# Patient Record
Sex: Male | Born: 1965 | Race: White | Hispanic: No | Marital: Married | Smoking: Never smoker
Health system: Southern US, Community
[De-identification: ages and names within clinical notes are randomized; demographics above are authoritative.]

## PROBLEM LIST (undated history)

## (undated) DIAGNOSIS — R5383 Other fatigue: Secondary | ICD-10-CM

## (undated) DIAGNOSIS — E785 Hyperlipidemia, unspecified: Secondary | ICD-10-CM

---

## 2009-10-05 ENCOUNTER — Ambulatory Visit: Payer: Self-pay | Admitting: Internal Medicine

## 2011-04-03 ENCOUNTER — Ambulatory Visit: Payer: Self-pay | Admitting: Internal Medicine

## 2014-06-25 ENCOUNTER — Ambulatory Visit: Payer: Self-pay | Admitting: Family Medicine

## 2015-09-07 ENCOUNTER — Ambulatory Visit
Admission: EM | Admit: 2015-09-07 | Discharge: 2015-09-07 | Disposition: A | Discharge status: Home / Self Care | Payer: Managed Care, Other (non HMO) | Attending: Family Medicine | Admitting: Family Medicine

## 2015-09-07 ENCOUNTER — Encounter: Payer: Self-pay | Admitting: Gynecology

## 2015-09-07 DIAGNOSIS — R1032 Left lower quadrant pain: Secondary | ICD-10-CM | POA: Diagnosis not present

## 2015-09-07 HISTORY — DX: Hyperlipidemia, unspecified: E78.5

## 2015-09-07 HISTORY — DX: Other fatigue: R53.83

## 2015-09-07 MED ORDER — IBUPROFEN 600 MG PO TABS: 600.0000 mg | ORAL_TABLET | Freq: Four times a day (QID) | ORAL | Status: AC | PRN

## 2015-09-07 NOTE — Discharge Instructions (Signed)
Ibuprofen 600mg  times a day with food for pain and inflammation Warm moist compresses up to 4 times a day After 2-3 weeks*core strengthening exercises See primary care provider if pain persists, lump, Fever or new symptoms  Abdominal Pain, Adult Many things can cause abdominal pain. Usually, abdominal pain is not caused by a disease and will improve without treatment. It can often be observed and treated at home. Your health care provider will do a physical exam and possibly order blood tests and X-rays to help determine the seriousness of your pain. However, in many cases, more time must pass before a clear cause of the pain can be found. Before that point, your health care provider may not know if you need more testing or further treatment. HOME CARE INSTRUCTIONS Monitor your abdominal pain for any changes. The following actions may help to alleviate any discomfort you are experiencing:  Only take over-the-counter or prescription medicines as directed by your health care provider.  Do not take laxatives unless directed to do so by your health care provider.  Try a clear liquid diet (broth, tea, or water) as directed by your health care provider. Slowly move to a bland diet as tolerated. SEEK MEDICAL CARE IF:  You have unexplained abdominal pain.  You have abdominal pain associated with nausea or diarrhea.  You have pain when you urinate or have a bowel movement.  You experience abdominal pain that wakes you in the night.  You have abdominal pain that is worsened or improved by eating food.  You have abdominal pain that is worsened with eating fatty foods.  You have a fever. SEEK IMMEDIATE MEDICAL CARE IF:  Your pain does not go away within 2 hours.  You keep throwing up (vomiting).  Your pain is felt only in portions of the abdomen, such as the right side or the left lower portion of the abdomen.  You pass bloody or black tarry stools. MAKE SURE YOU:  Understand these  instructions.  Will watch your condition.  Will get help right away if you are not doing well or get worse.   This information is not intended to replace advice given to you by your health care provider. Make sure you discuss any questions you have with your health care provider.   Document Released: 12/30/2004 Document Revised: 12/11/2014 Document Reviewed: 11/29/2012 Elsevier Interactive Patient Education [employer]

## 2015-09-07 NOTE — ED Provider Notes (Signed)
CSN: [id number]     Arrival date & time 09/07/15  1212 History   First MD Initiated Contact with Patient 09/07/15 1404     Chief Complaint  Patient presents with  . Abdominal Pain   HPI   Maree KrabbeRobert A Losasso is a pleasant 50 y.o. male who presents with acute abdominal pain. He states he was and how I last week when he developed left lower quadrant pain. The pain is described as a burning and pulling type pain that last a few seconds to a minute and occurs intermittently throughout the day. It always occurs with bending over and sitting straight up. Pain currently is 0/10. Pain does not radiate. Denies any history of GI problems. His gums been normal 1-2 daily. Denies rectal bleeding, melena, mucus in his stools. Denies fever chills. He has not tried anything for the pain.  Past Medical History  Diagnosis Date  . Hyperlipemia   . Fatigue    History reviewed. No pertinent past surgical history. History reviewed. No pertinent family history. Social History  Substance Use Topics  . Smoking status: Former Games developermoker  . Smokeless tobacco: None  . Alcohol Use: Yes    Review of Systems  Constitutional: Negative.   HENT: Negative.   Eyes: Negative.   Respiratory: Negative.   Cardiovascular: Negative.   Genitourinary: Negative.   Musculoskeletal: Negative.   Skin: Negative.   Neurological: Negative.   Hematological: Negative.   Psychiatric/Behavioral: Negative.     Allergies  Review of patient's allergies indicates no known allergies.  Home Medications   Prior to Admission medications   Medication Sig Start Date End Date Taking? Authorizing Provider  ibuprofen (ADVIL,MOTRIN) 600 MG tablet Take 1 tablet (600 mg total) by mouth every 6 (six) hours as needed. 09/07/15   Joselyn ArrowKandice L Christon Gallaway, NP   Meds Ordered and Administered this Visit  Medications - No data to display  BP 130/82 mmHg  Pulse 68  Temp(Src) 97.8 F (36.6 C) (Oral)  Resp 16  Ht 1' (0.305 m)  Wt 180 lb (81.647 kg)  BMI 877.69  kg/m2  SpO2 98% No data found.   Physical Exam  Constitutional: He is oriented to person, place, and time. He appears well-developed and well-nourished. No distress.  HENT:  Head: Normocephalic and atraumatic.  Eyes: Conjunctivae are normal. No scleral icterus.  Neck: Normal range of motion.  Cardiovascular: Normal rate and regular rhythm.   Pulmonary/Chest: Effort normal and breath sounds normal. No respiratory distress.  Abdominal: Soft. Bowel sounds are normal. He exhibits no distension. There is tenderness in the left lower quadrant. There is no rebound and no guarding. No hernia.  He has a positive Carnett sign with tenderness to the left lower quadrant abdominal wall with abdominal flexion. No appreciation of significant hernia.  Musculoskeletal: Normal range of motion. He exhibits no edema or tenderness.  Neurological: He is alert and oriented to person, place, and time. No cranial nerve deficit.  Skin: Skin is warm and dry. No rash noted. No erythema.  Psychiatric: His behavior is normal. Judgment normal.  Nursing note and vitals reviewed.   ED Course  Procedures NA  MDM   1. Abdominal wall pain in left lower quadrant   Abdominal pain is likely left lower quadrant abdominal wall strain given his tenderness with direct palpation to his flexed abdominal wall. It is also reassuring that the symptoms only last seconds to minutes and there is no change in bowel habits.  No direct appreciation of herniation at  this time although he was given alarm features to watch out for including bulge, pain, or fever.   Plan: Diagnosis reviewed with patient & wife Rx as per orders;  benefits, risks, potential side effects reviewed  Recommend supportive treatment with rest, Warm compresses 4 times a day, and gradual core strengthening exercises See primary care provider if pain persists, lump, Fever or new symptoms   Joselyn Arrow, NP 09/07/15 1654

## 2015-09-07 NOTE — ED Notes (Signed)
[place] stated [place] and them to [place]. Per patient Pain in Left lower Quadrant pain x couple weeks start while he was in [place]. Patient denies NVD.

## 2017-06-20 ENCOUNTER — Ambulatory Visit
Admission: EM | Admit: 2017-06-20 | Discharge: 2017-06-20 | Disposition: A | Discharge status: Home / Self Care | Payer: Managed Care, Other (non HMO) | Attending: Family Medicine | Admitting: Family Medicine

## 2017-06-20 ENCOUNTER — Ambulatory Visit (INDEPENDENT_AMBULATORY_CARE_PROVIDER_SITE_OTHER): Payer: Managed Care, Other (non HMO)

## 2017-06-20 ENCOUNTER — Encounter: Payer: Self-pay | Admitting: *Deleted

## 2017-06-20 DIAGNOSIS — M25532 Pain in left wrist: Secondary | ICD-10-CM | POA: Diagnosis not present

## 2017-06-20 DIAGNOSIS — S63502A Unspecified sprain of left wrist, initial encounter: Secondary | ICD-10-CM

## 2017-06-20 DIAGNOSIS — W19XXXA Unspecified fall, initial encounter: Secondary | ICD-10-CM | POA: Diagnosis not present

## 2017-06-20 NOTE — ED Triage Notes (Signed)
Pt fell during "fencing" class last night. While stepping back, pt lost balance and fell landing on left arm. Left arm now discolored, mildly edematous, and painful.

## 2017-06-20 NOTE — ED Provider Notes (Signed)
[facility]    CSN: [id number] Arrival date & time: 06/20/17  1214     History   Chief Complaint Chief Complaint  Patient presents with  . Wrist Pain    HPI Edwin Cooper is a 52 y.o. male.   52 yo male with a c/o left wrist pain and swelling after injuring it yesterday while fencing. States he lost his balance, falling backwards and landing on his outstretched left hand.    The history is provided by the patient.    Past Medical History:  Diagnosis Date  . Fatigue   . Hyperlipemia     There are no active problems to display for this patient.   History reviewed. No pertinent surgical history.     Home Medications    Prior to Admission medications   Medication Sig Start Date End Date Taking? Authorizing Provider  ibuprofen (ADVIL,MOTRIN) 600 MG tablet Take 1 tablet (600 mg total) by mouth every 6 (six) hours as needed. 09/07/15   Joselyn Arrow, NP    Family History Family History  Problem Relation Age of Onset  . Healthy Mother   . Healthy Father     Social History Social History   Tobacco Use  . Smoking status: Never Smoker  . Smokeless tobacco: Never Used  Substance Use Topics  . Alcohol use: Yes  . Drug use: No     Allergies   Patient has no known allergies.   Review of Systems Review of Systems   Physical Exam Triage Vital Signs ED Triage Vitals  Enc Vitals Group     BP 06/20/17 1226 124/80     Pulse Rate 06/20/17 1226 64     Resp 06/20/17 1226 16     Temp 06/20/17 1226 98.8 F (37.1 C)     Temp Source 06/20/17 1226 Oral     SpO2 06/20/17 1226 98 %     Weight 06/20/17 1230 185 lb (83.9 kg)     Height 06/20/17 1230 6\' 1"  (1.854 m)     Head Circumference --      Peak Flow --      Pain Score 06/20/17 1228 7     Pain Loc --      Pain Edu? --      Excl. in GC? --    No data found.  Updated Vital Signs BP 124/80 (BP Location: Left Arm)   Pulse 64   Temp 98.8 F (37.1 C) (Oral)   Resp 16   Ht 6\' 1"  (1.854  m)   Wt 185 lb (83.9 kg)   SpO2 98%   BMI 24.41 kg/m   Visual Acuity Right Eye Distance:   Left Eye Distance:   Bilateral Distance:    Right Eye Near:   Left Eye Near:    Bilateral Near:     Physical Exam  Constitutional: He appears well-developed and well-nourished. No distress.  Musculoskeletal:       Left wrist: He exhibits tenderness, bony tenderness and swelling (mild). He exhibits normal range of motion, no effusion, no crepitus, no deformity and no laceration.  Skin: He is not diaphoretic.  Nursing note and vitals reviewed.    UC Treatments / Results  Labs (all labs ordered are listed, but only abnormal results are displayed) Labs Reviewed - No data to display  EKG  EKG Interpretation None       Radiology Dg Wrist Complete Left  Result Date: 06/20/2017 CLINICAL DATA:  The patient suffered a  left wrist injury due to a fall while fencing yesterday. Initial encounter. EXAM: LEFT WRIST - COMPLETE 3+ VIEW COMPARISON:  None. FINDINGS: There is no evidence of fracture or dislocation. There is no evidence of arthropathy or other focal bone abnormality. Soft tissues are unremarkable. IMPRESSION: Negative exam. Electronically Signed   By: Drusilla Kannerhomas  Dalessio M.D.   On: 06/20/2017 12:54    Procedures Procedures (including critical care time)  Medications Ordered in UC Medications - No data to display   Initial Impression / Assessment and Plan / UC Course  I have reviewed the triage vital signs and the nursing notes.  Pertinent labs & imaging results that were available during my care of the patient were reviewed by me and considered in my medical decision making (see chart for details).       Final Clinical Impressions(s) / UC Diagnoses   Final diagnoses:  Sprain of left wrist, initial encounter    ED Discharge Orders    None     1. x-ray results and diagnosis reviewed with patient 2. rx as per orders above; reviewed possible side effects, interactions,  risks and benefits  3. Recommend supportive treatment with rest, ice, elevation 4. Follow-up prn if symptoms worsen or don't improve  Controlled Substance Prescriptions [place] Controlled Substance Registry consulted? Not Applicable   Payton Mccallumonty, Sriansh Farra, MD 06/20/17 [phone]

## 2018-12-20 IMAGING — CR DG WRIST COMPLETE 3+V*L*
4 series · 4 of 4 positions shown · non-contrast
Comparison: None.

CLINICAL DATA: The patient suffered a left wrist injury due to a
fall while fencing yesterday. Initial encounter.

EXAM:
LEFT WRIST - COMPLETE 3+ VIEW

[wrist pa]
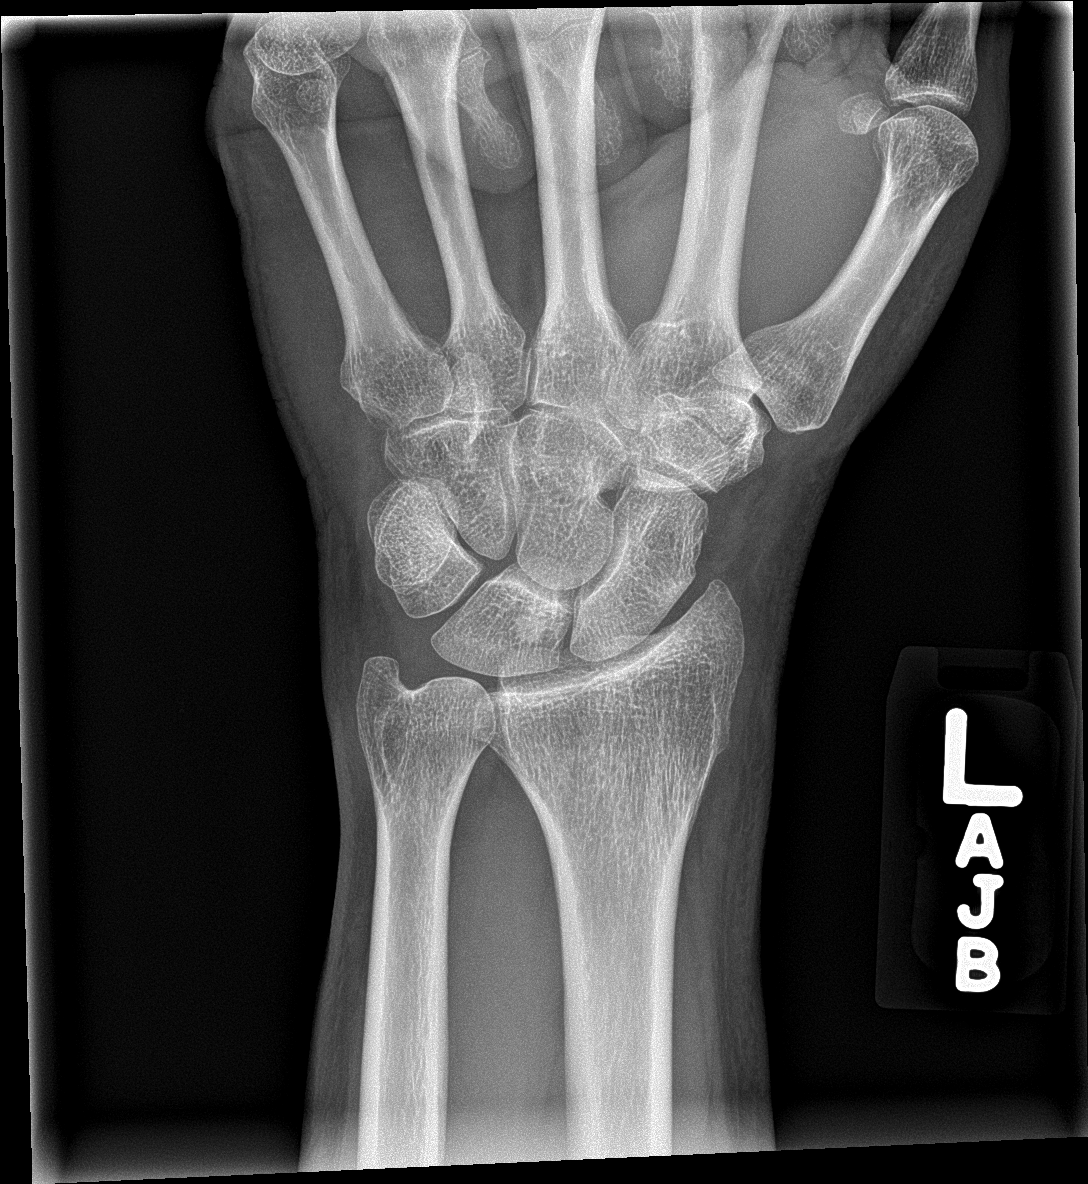

[wrist obl]
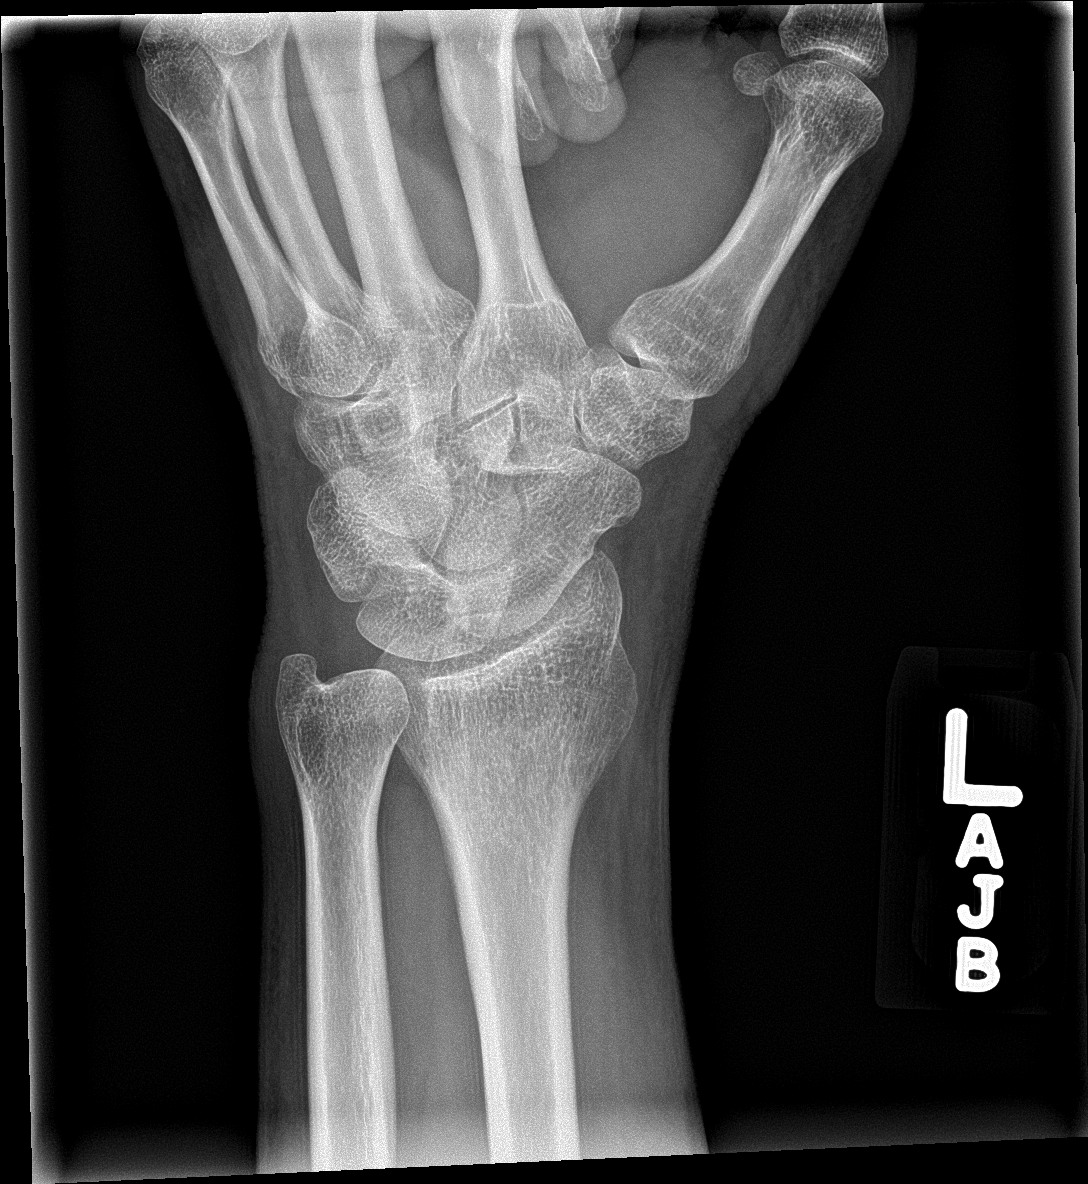

[wrist lat]
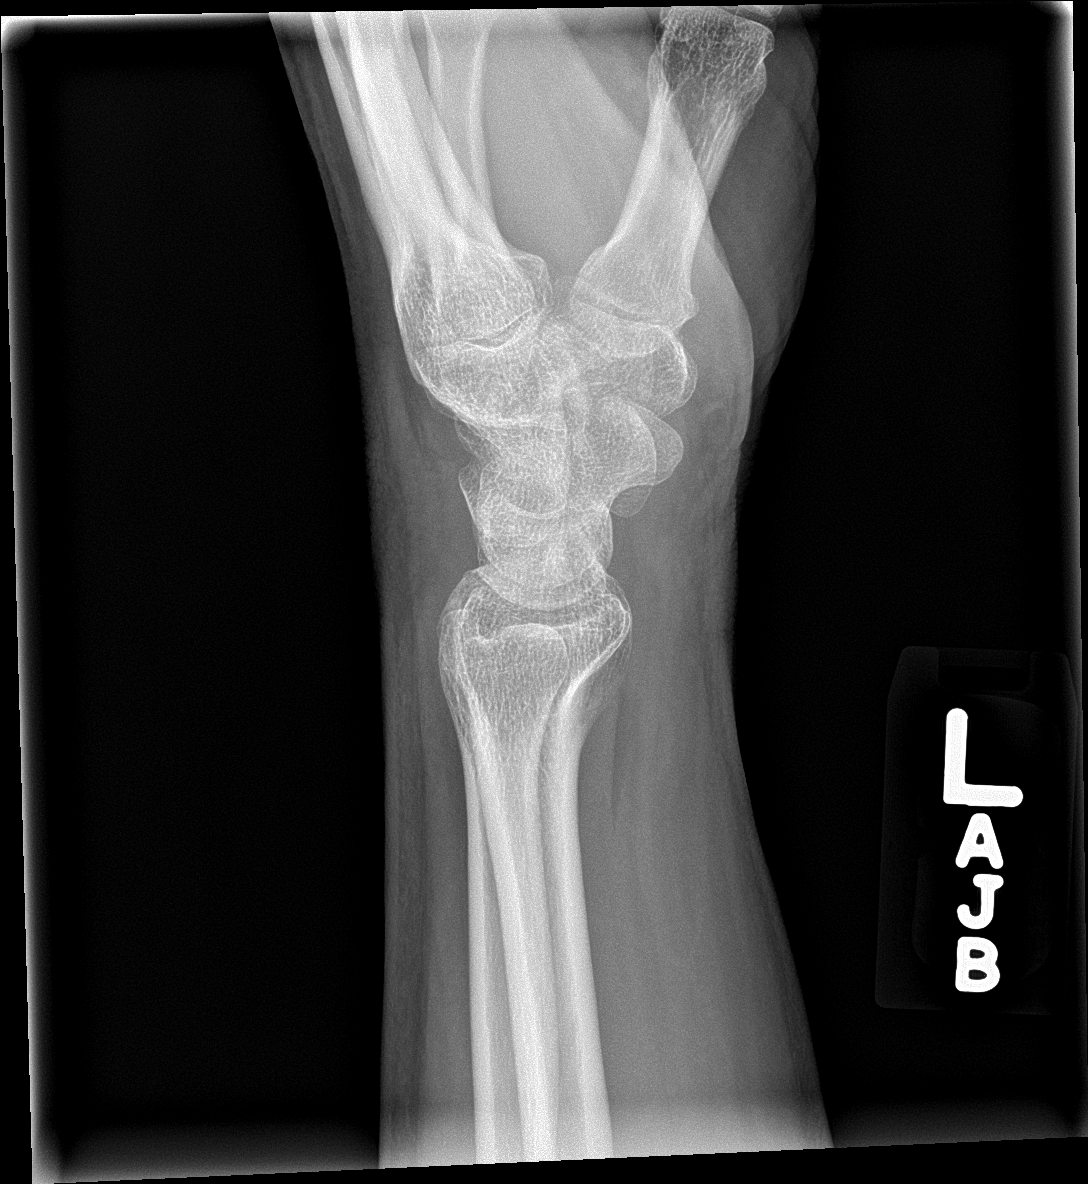

[wrist navicular]
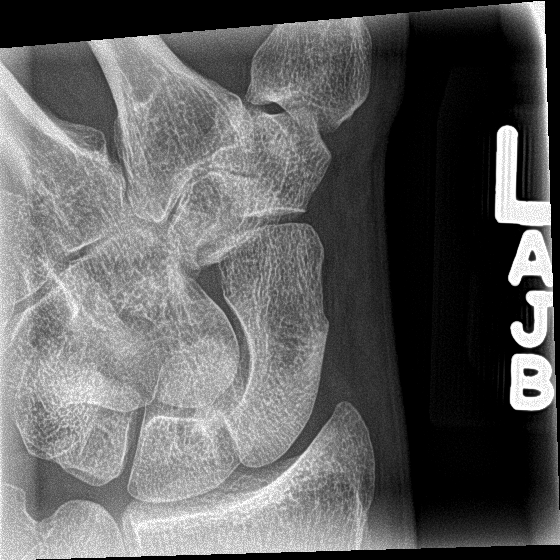

[4 of 4 positions shown; findings below may reference images not displayed]

FINDINGS: There is no evidence of fracture or dislocation. There is no
evidence of arthropathy or other focal bone abnormality. Soft
tissues are unremarkable.
IMPRESSION: Negative exam.
